# Patient Record
Sex: Female | Born: 1985
Health system: Southern US, Community
[De-identification: ages and names within clinical notes are randomized; demographics above are authoritative.]

## PROBLEM LIST (undated history)

## (undated) DIAGNOSIS — B009 Herpesviral infection, unspecified: Secondary | ICD-10-CM

## (undated) DIAGNOSIS — F329 Major depressive disorder, single episode, unspecified: Secondary | ICD-10-CM

## (undated) DIAGNOSIS — F32A Depression, unspecified: Secondary | ICD-10-CM

## (undated) HISTORY — PX: WISDOM TOOTH EXTRACTION: SHX21

---

## 2009-11-07 ENCOUNTER — Other Ambulatory Visit: Admission: RE | Admit: 2009-11-07 | Discharge: 2009-11-07 | Payer: Self-pay | Admitting: Family Medicine

## 2012-06-15 ENCOUNTER — Other Ambulatory Visit: Payer: Self-pay | Admitting: Family Medicine

## 2012-06-15 ENCOUNTER — Other Ambulatory Visit (HOSPITAL_COMMUNITY)
Admission: RE | Admit: 2012-06-15 | Discharge: 2012-06-15 | Disposition: A | Discharge status: Home / Self Care | Payer: BC Managed Care – PPO | Source: Ambulatory Visit | Attending: Family Medicine | Admitting: Family Medicine

## 2012-06-15 DIAGNOSIS — Z124 Encounter for screening for malignant neoplasm of cervix: Secondary | ICD-10-CM | POA: Insufficient documentation

## 2013-01-20 ENCOUNTER — Emergency Department (HOSPITAL_COMMUNITY)
Admission: EM | Admit: 2013-01-20 | Discharge: 2013-01-20 | Disposition: A | Discharge status: Home / Self Care | Payer: BC Managed Care – PPO | Attending: Emergency Medicine | Admitting: Emergency Medicine

## 2013-01-20 ENCOUNTER — Encounter (HOSPITAL_COMMUNITY): Payer: Self-pay | Admitting: Emergency Medicine

## 2013-01-20 DIAGNOSIS — Z3202 Encounter for pregnancy test, result negative: Secondary | ICD-10-CM | POA: Insufficient documentation

## 2013-01-20 DIAGNOSIS — Z79899 Other long term (current) drug therapy: Secondary | ICD-10-CM | POA: Insufficient documentation

## 2013-01-20 DIAGNOSIS — R110 Nausea: Secondary | ICD-10-CM | POA: Insufficient documentation

## 2013-01-20 DIAGNOSIS — F17200 Nicotine dependence, unspecified, uncomplicated: Secondary | ICD-10-CM | POA: Insufficient documentation

## 2013-01-20 DIAGNOSIS — N12 Tubulo-interstitial nephritis, not specified as acute or chronic: Secondary | ICD-10-CM | POA: Insufficient documentation

## 2013-01-20 LAB — CBC WITH DIFFERENTIAL/PLATELET
Basophils Absolute: 0.1 10*3/uL (ref 0.0–0.1)
Basophils Relative: 1 % (ref 0–1)
Eosinophils Absolute: 0.4 10*3/uL (ref 0.0–0.7)
HCT: 37.2 % (ref 36.0–46.0)
MCH: 30.9 pg (ref 26.0–34.0)
MCHC: 34.1 g/dL (ref 30.0–36.0)
Monocytes Absolute: 1.2 10*3/uL — ABNORMAL HIGH (ref 0.1–1.0)
Neutro Abs: 6.6 10*3/uL (ref 1.7–7.7)
RDW: 12.2 % (ref 11.5–15.5)

## 2013-01-20 LAB — POCT I-STAT, CHEM 8
Calcium, Ion: 1.33 mmol/L — ABNORMAL HIGH (ref 1.12–1.23)
Chloride: 102 mEq/L (ref 96–112)
Glucose, Bld: 96 mg/dL (ref 70–99)
HCT: 42 % (ref 36.0–46.0)
Hemoglobin: 14.3 g/dL (ref 12.0–15.0)
Potassium: 3.5 mEq/L (ref 3.5–5.1)
TCO2: 24 mmol/L (ref 0–100)

## 2013-01-20 LAB — URINE MICROSCOPIC-ADD ON

## 2013-01-20 LAB — URINALYSIS, ROUTINE W REFLEX MICROSCOPIC
Bilirubin Urine: NEGATIVE
Protein, ur: 100 mg/dL — AB
Specific Gravity, Urine: 1.016 (ref 1.005–1.030)
Urobilinogen, UA: 0.2 mg/dL (ref 0.0–1.0)
pH: 6.5 (ref 5.0–8.0)

## 2013-01-20 LAB — AMYLASE: Amylase: 63 U/L (ref 0–105)

## 2013-01-20 LAB — LIPASE, BLOOD: Lipase: 29 U/L (ref 11–59)

## 2013-01-20 MED ORDER — ONDANSETRON HCL 4 MG PO TABS: 4.0000 mg | ORAL_TABLET | Freq: Four times a day (QID) | ORAL | Status: DC

## 2013-01-20 MED ORDER — HYDROCODONE-ACETAMINOPHEN 5-325 MG PO TABS: 1.0000 | ORAL_TABLET | ORAL | Status: DC | PRN

## 2013-01-20 MED ORDER — ONDANSETRON HCL 4 MG/2ML IJ SOLN
4.0000 mg | Freq: Once | INTRAMUSCULAR | Status: AC
  Administered 2013-01-20: 4 mg via INTRAVENOUS
  Filled 2013-01-20: qty 2

## 2013-01-20 MED ORDER — SODIUM CHLORIDE 0.9 % IV BOLUS (SEPSIS)
1000.0000 mL | Freq: Once | INTRAVENOUS | Status: AC
  Administered 2013-01-20: 1000 mL via INTRAVENOUS

## 2013-01-20 MED ORDER — CIPROFLOXACIN IN D5W 400 MG/200ML IV SOLN
400.0000 mg | Freq: Once | INTRAVENOUS | Status: AC
  Administered 2013-01-20: 400 mg via INTRAVENOUS
  Filled 2013-01-20: qty 200

## 2013-01-20 MED ORDER — MORPHINE SULFATE 4 MG/ML IJ SOLN
4.0000 mg | Freq: Once | INTRAMUSCULAR | Status: AC
Start: 2013-01-20 — End: 2013-01-20
  Administered 2013-01-20: 4 mg via INTRAVENOUS
  Filled 2013-01-20: qty 1

## 2013-01-20 NOTE — ED Notes (Signed)
Pt was seen Monday night and treated for a UTI, she now complains of bilateral side pain that wasn't there on Monday

## 2013-01-20 NOTE — ED Provider Notes (Signed)
CSN: [id number]     Arrival date & time 01/20/13  0043 History   First MD Initiated Contact with Patient 01/20/13 0132     Chief Complaint  Patient presents with  . Abdominal Pain   (Consider location/radiation/quality/duration/timing/severity/associated sxs/prior Treatment) HPI  Patient presents to the ED with complaints of bilateral flank pain. She was seen by her PCP at [facility] for dysuria and diagnosed with UTI, given Rx for Macrobid but did not get it because of insurance problems until Tuesday evening. She has taken only one dose but is now having moderate/severe bilateral flank pain and nausea. She has not had any vomiting, diarrhea, hematuria, fevers, weakness, chills. She denies frequent history of UTI or ever having pyelo.  History reviewed. No pertinent past medical history. History reviewed. No pertinent past surgical history. History reviewed. No pertinent family history. History  Substance Use Topics  . Smoking status: Current Every Day Smoker  . Smokeless tobacco: Not on file  . Alcohol Use: No   OB History   Grav Para Term Preterm Abortions TAB SAB Ect Mult Living                 Review of Systems The patient denies anorexia, fever, weight loss,, vision loss, decreased hearing, hoarseness, chest pain, syncope, dyspnea on exertion, peripheral edema, balance deficits, hemoptysis, abdominal pain, melena, hematochezia, severe indigestion/heartburn, hematuria, incontinence, genital sores, muscle weakness, suspicious skin lesions, transient blindness, difficulty walking, depression, unusual weight change, abnormal bleeding, enlarged lymph nodes, angioedema, and breast masses.  Allergies  Doxycycline; Cephalosporins; and Sulfa antibiotics  Home Medications   Current Outpatient Rx  Name  Route  Sig  Dispense  Refill  . levonorgestrel-ethinyl estradiol (AVIANE,ALESSE,LESSINA) 0.1-20 MG-MCG tablet   Oral   Take 1 tablet by mouth daily.         .  nitrofurantoin, macrocrystal-monohydrate, (MACROBID) 100 MG capsule   Oral   Take 100 mg by mouth 2 (two) times daily. For 7 days only          BP 121/76  Pulse 83  Temp(Src) 99 F (37.2 C) (Oral)  Resp 18  Ht 5' (1.524 m)  Wt 108 lb (48.988 kg)  BMI 21.09 kg/m2  SpO2 99%  LMP 01/06/2013 Physical Exam  Nursing note and vitals reviewed. Constitutional: She appears well-developed and well-nourished. No distress.  HENT:  Head: Normocephalic and atraumatic.  Eyes: Pupils are equal, round, and reactive to light.  Neck: Normal range of motion. Neck supple.  Cardiovascular: Normal rate and regular rhythm.   Pulmonary/Chest: Effort normal.  Abdominal: Soft. There is tenderness. There is CVA tenderness. There is no rebound and no guarding.    Neurological: She is alert.  Skin: Skin is warm and dry.    ED Course  Procedures (including critical care time) Labs Review Labs Reviewed  CBC WITH DIFFERENTIAL - Abnormal; Notable for the following:    Monocytes Absolute 1.2 (*)    All other components within normal limits  URINALYSIS, ROUTINE W REFLEX MICROSCOPIC - Abnormal; Notable for the following:    APPearance CLOUDY (*)    Hgb urine dipstick LARGE (*)    Protein, ur 100 (*)    Leukocytes, UA MODERATE (*)    All other components within normal limits  POCT I-STAT, CHEM 8 - Abnormal; Notable for the following:    Calcium, Ion 1.33 (*)    All other components within normal limits  URINE CULTURE  AMYLASE  LIPASE, BLOOD  URINE MICROSCOPIC-ADD ON  POCT  PREGNANCY, URINE   Imaging Review No results found.  EKG Interpretation   None       MDM   1. Pyelonephritis      Pt clinically has pyelo. Allergic to Rocephin so will give Cipro IV and pain medication. Culture pending. Will give Rx for Vicodin and Zofran to treat for pain.  Patient otherwise appears well, labs and vitals are reassuring. She should do well being treated as an outpatient.  27 y.o.Shelly Nash's  evaluation in the Emergency Department is complete. It has been determined that no acute conditions requiring further emergency intervention are present at this time. The patient/guardian have been advised of the diagnosis and plan. We have discussed signs and symptoms that warrant return to the ED, such as changes or worsening in symptoms.  Vital signs are stable at discharge. Filed Vitals:   01/20/13 0051  BP: 121/76  Pulse: 83  Temp: 99 F (37.2 C)  Resp: 18    Patient/guardian has voiced understanding and agreed to follow-up with the PCP or specialist.    Dorthula Matas, PA-C 01/20/13 [phone]

## 2013-01-20 NOTE — ED Provider Notes (Signed)
Medical screening examination/treatment/procedure(s) were performed by non-physician practitioner and as supervising physician I was immediately available for consultation/collaboration.  Willian Donson M Kahne Helfand, MD 01/20/13 0349 

## 2013-01-21 LAB — URINE CULTURE: Culture: NO GROWTH

## 2014-03-16 LAB — OB RESULTS CONSOLE GC/CHLAMYDIA
Chlamydia: NEGATIVE
Gonorrhea: NEGATIVE

## 2014-03-16 LAB — OB RESULTS CONSOLE RUBELLA ANTIBODY, IGM: RUBELLA: IMMUNE

## 2014-03-16 LAB — OB RESULTS CONSOLE RPR: RPR: NONREACTIVE

## 2014-03-16 LAB — OB RESULTS CONSOLE HEPATITIS B SURFACE ANTIGEN: Hepatitis B Surface Ag: NEGATIVE

## 2014-03-16 LAB — OB RESULTS CONSOLE HIV ANTIBODY (ROUTINE TESTING): HIV: NONREACTIVE

## 2014-04-08 NOTE — L&D Delivery Note (Signed)
Patient was C/C/+1 and pushed for 30 minutes with epidural.   NSVD female infant, Apgars and weight pending The patient had a small midline vaginal wall laceration repaired wit h2-0 vicryl rapide. Fundus was firm. EBL was expected amount. Placenta was delivered intact. Vagina was clear.  Baby was vigorous and doing skin to skin with mother.  Shelly Nash

## 2014-09-27 LAB — OB RESULTS CONSOLE GBS: GBS: NEGATIVE

## 2014-10-29 ENCOUNTER — Encounter (HOSPITAL_COMMUNITY): Payer: Self-pay

## 2014-10-29 ENCOUNTER — Inpatient Hospital Stay (HOSPITAL_COMMUNITY)
Admission: AD | Admit: 2014-10-29 | Discharge: 2014-10-31 | DRG: 775 | Disposition: A | Discharge status: Home / Self Care | Payer: 59 | Source: Ambulatory Visit | Attending: Obstetrics and Gynecology | Admitting: Obstetrics and Gynecology

## 2014-10-29 ENCOUNTER — Inpatient Hospital Stay (HOSPITAL_COMMUNITY)
Admission: AD | Admit: 2014-10-29 | Discharge: 2014-10-29 | Disposition: A | Discharge status: Home / Self Care | Payer: 59 | Source: Ambulatory Visit | Attending: Obstetrics and Gynecology | Admitting: Obstetrics and Gynecology

## 2014-10-29 DIAGNOSIS — F17210 Nicotine dependence, cigarettes, uncomplicated: Secondary | ICD-10-CM | POA: Diagnosis present

## 2014-10-29 DIAGNOSIS — Z3A40 40 weeks gestation of pregnancy: Secondary | ICD-10-CM | POA: Diagnosis present

## 2014-10-29 DIAGNOSIS — O99334 Smoking (tobacco) complicating childbirth: Secondary | ICD-10-CM | POA: Diagnosis present

## 2014-10-29 DIAGNOSIS — Z3493 Encounter for supervision of normal pregnancy, unspecified, third trimester: Secondary | ICD-10-CM | POA: Insufficient documentation

## 2014-10-29 HISTORY — DX: Herpesviral infection, unspecified: B00.9

## 2014-10-29 HISTORY — DX: Major depressive disorder, single episode, unspecified: F32.9

## 2014-10-29 HISTORY — DX: Depression, unspecified: F32.A

## 2014-10-29 LAB — CBC
HCT: 34.5 % — ABNORMAL LOW (ref 36.0–46.0)
Hemoglobin: 11.9 g/dL — ABNORMAL LOW (ref 12.0–15.0)
MCH: 32 pg (ref 26.0–34.0)
MCHC: 34.5 g/dL (ref 30.0–36.0)
MCV: 92.7 fL (ref 78.0–100.0)
PLATELETS: 259 10*3/uL (ref 150–400)
RBC: 3.72 MIL/uL — AB (ref 3.87–5.11)
RDW: 13.3 % (ref 11.5–15.5)
WBC: 13.8 10*3/uL — AB (ref 4.0–10.5)

## 2014-10-29 LAB — TYPE AND SCREEN
ABO/RH(D): A POS
Antibody Screen: NEGATIVE

## 2014-10-29 LAB — POCT FERN TEST: POCT Fern Test: POSITIVE

## 2014-10-29 MED ORDER — TERBUTALINE SULFATE 1 MG/ML IJ SOLN: 0.2500 mg | Freq: Once | INTRAMUSCULAR | Status: AC | PRN

## 2014-10-29 MED ORDER — CITRIC ACID-SODIUM CITRATE 334-500 MG/5ML PO SOLN: 30.0000 mL | ORAL | Status: DC | PRN

## 2014-10-29 MED ORDER — LIDOCAINE HCL (PF) 1 % IJ SOLN
30.0000 mL | INTRAMUSCULAR | Status: DC | PRN
  Administered 2014-10-30: 30 mL via SUBCUTANEOUS
  Filled 2014-10-29: qty 30

## 2014-10-29 MED ORDER — ACETAMINOPHEN 325 MG PO TABS: 650.0000 mg | ORAL_TABLET | ORAL | Status: DC | PRN

## 2014-10-29 MED ORDER — ONDANSETRON HCL 4 MG/2ML IJ SOLN
4.0000 mg | Freq: Four times a day (QID) | INTRAMUSCULAR | Status: DC | PRN
  Administered 2014-10-30: 4 mg via INTRAVENOUS
  Filled 2014-10-29: qty 2

## 2014-10-29 MED ORDER — OXYCODONE-ACETAMINOPHEN 5-325 MG PO TABS: 2.0000 | ORAL_TABLET | ORAL | Status: DC | PRN

## 2014-10-29 MED ORDER — FLEET ENEMA 7-19 GM/118ML RE ENEM: 1.0000 | ENEMA | RECTAL | Status: DC | PRN

## 2014-10-29 MED ORDER — OXYTOCIN 40 UNITS IN LACTATED RINGERS INFUSION - SIMPLE MED
1.0000 m[IU]/min | INTRAVENOUS | Status: DC
  Administered 2014-10-29: 2 m[IU]/min via INTRAVENOUS
  Filled 2014-10-29: qty 1000

## 2014-10-29 MED ORDER — OXYTOCIN BOLUS FROM INFUSION
500.0000 mL | INTRAVENOUS | Status: DC
  Administered 2014-10-30: 500 mL via INTRAVENOUS

## 2014-10-29 MED ORDER — BUTORPHANOL TARTRATE 1 MG/ML IJ SOLN
1.0000 mg | INTRAMUSCULAR | Status: DC | PRN
  Administered 2014-10-29: 1 mg via INTRAVENOUS
  Filled 2014-10-29: qty 1

## 2014-10-29 MED ORDER — LACTATED RINGERS IV SOLN: 500.0000 mL | INTRAVENOUS | Status: DC | PRN

## 2014-10-29 MED ORDER — OXYTOCIN 40 UNITS IN LACTATED RINGERS INFUSION - SIMPLE MED: 62.5000 mL/h | INTRAVENOUS | Status: DC

## 2014-10-29 MED ORDER — OXYCODONE-ACETAMINOPHEN 5-325 MG PO TABS
1.0000 | ORAL_TABLET | ORAL | Status: DC | PRN
Start: 2014-10-29 — End: 2014-10-30

## 2014-10-29 MED ORDER — LACTATED RINGERS IV SOLN
INTRAVENOUS | Status: DC
  Administered 2014-10-29 – 2014-10-30 (×2): via INTRAVENOUS

## 2014-10-29 NOTE — Discharge Instructions (Signed)
Third Trimester of Pregnancy °The third trimester is from week 29 through week 42, months 7 through 9. The third trimester is a time when the fetus is growing rapidly. At the end of the ninth month, the fetus is about 20 inches in length and weighs 6-10 pounds.  °BODY CHANGES °Your body goes through many changes during pregnancy. The changes vary from woman to woman.  °· Your weight will continue to increase. You can expect to gain 25-35 pounds (11-16 kg) by the end of the pregnancy. °· You may begin to get stretch marks on your hips, abdomen, and breasts. °· You may urinate more often because the fetus is moving lower into your pelvis and pressing on your bladder. °· You may develop or continue to have heartburn as a result of your pregnancy. °· You may develop constipation because certain hormones are causing the muscles that push waste through your intestines to slow down. °· You may develop hemorrhoids or swollen, bulging veins (varicose veins). °· You may have pelvic pain because of the weight gain and pregnancy hormones relaxing your joints between the bones in your pelvis. Backaches may result from overexertion of the muscles supporting your posture. °· You may have changes in your hair. These can include thickening of your hair, rapid growth, and changes in texture. Some women also have hair loss during or after pregnancy, or hair that feels dry or thin. Your hair will most likely return to normal after your baby is born. °· Your breasts will continue to grow and be tender. A yellow discharge may leak from your breasts called colostrum. °· Your belly button may stick out. °· You may feel short of breath because of your expanding uterus. °· You may notice the fetus "dropping," or moving lower in your abdomen. °· You may have a bloody mucus discharge. This usually occurs a few days to a week before labor begins. °· Your cervix becomes thin and soft (effaced) near your due date. °WHAT TO EXPECT AT YOUR PRENATAL  EXAMS  °You will have prenatal exams every 2 weeks until week 36. Then, you will have weekly prenatal exams. During a routine prenatal visit: °· You will be weighed to make sure you and the fetus are growing normally. °· Your blood pressure is taken. °· Your abdomen will be measured to track your baby's growth. °· The fetal heartbeat will be listened to. °· Any test results from the previous visit will be discussed. °· You may have a cervical check near your due date to see if you have effaced. °At around 36 weeks, your caregiver will check your cervix. At the same time, your caregiver will also perform a test on the secretions of the vaginal tissue. This test is to determine if a type of bacteria, Group B streptococcus, is present. Your caregiver will explain this further. °Your caregiver may ask you: °· What your birth plan is. °· How you are feeling. °· If you are feeling the baby move. °· If you have had any abnormal symptoms, such as leaking fluid, bleeding, severe headaches, or abdominal cramping. °· If you have any questions. °Other tests or screenings that may be performed during your third trimester include: °· Blood tests that check for low iron levels (anemia). °· Fetal testing to check the health, activity level, and growth of the fetus. Testing is done if you have certain medical conditions or if there are problems during the pregnancy. °FALSE LABOR °You may feel small, irregular contractions that   eventually go away. These are called Braxton Hicks contractions, or false labor. Contractions may last for hours, days, or even weeks before true labor sets in. If contractions come at regular intervals, intensify, or become painful, it is best to be seen by your caregiver.  °SIGNS OF LABOR  °· Menstrual-like cramps. °· Contractions that are 5 minutes apart or less. °· Contractions that start on the top of the uterus and spread down to the lower abdomen and back. °· A sense of increased pelvic pressure or back  pain. °· A watery or bloody mucus discharge that comes from the vagina. °If you have any of these signs before the 37th week of pregnancy, call your caregiver right away. You need to go to the hospital to get checked immediately. °HOME CARE INSTRUCTIONS  °· Avoid all smoking, herbs, alcohol, and unprescribed drugs. These chemicals affect the formation and growth of the baby. °· Follow your caregiver's instructions regarding medicine use. There are medicines that are either safe or unsafe to take during pregnancy. °· Exercise only as directed by your caregiver. Experiencing uterine cramps is a good sign to stop exercising. °· Continue to eat regular, healthy meals. °· Wear a good support bra for breast tenderness. °· Do not use hot tubs, steam rooms, or saunas. °· Wear your seat belt at all times when driving. °· Avoid raw meat, uncooked cheese, cat litter boxes, and soil used by cats. These carry germs that can cause birth defects in the baby. °· Take your prenatal vitamins. °· Try taking a stool softener (if your caregiver approves) if you develop constipation. Eat more high-fiber foods, such as fresh vegetables or fruit and whole grains. Drink plenty of fluids to keep your urine clear or pale yellow. °· Take warm sitz baths to soothe any pain or discomfort caused by hemorrhoids. Use hemorrhoid cream if your caregiver approves. °· If you develop varicose veins, wear support hose. Elevate your feet for 15 minutes, 3-4 times a day. Limit salt in your diet. °· Avoid heavy lifting, wear low heal shoes, and practice good posture. °· Rest a lot with your legs elevated if you have leg cramps or low back pain. °· Visit your dentist if you have not gone during your pregnancy. Use a soft toothbrush to brush your teeth and be gentle when you floss. °· A sexual relationship may be continued unless your caregiver directs you otherwise. °· Do not travel far distances unless it is absolutely necessary and only with the approval  of your caregiver. °· Take prenatal classes to understand, practice, and ask questions about the labor and delivery. °· Make a trial run to the hospital. °· Pack your hospital bag. °· Prepare the baby's nursery. °· Continue to go to all your prenatal visits as directed by your caregiver. °SEEK MEDICAL CARE IF: °· You are unsure if you are in labor or if your water has broken. °· You have dizziness. °· You have mild pelvic cramps, pelvic pressure, or nagging pain in your abdominal area. °· You have persistent nausea, vomiting, or diarrhea. °· You have a bad smelling vaginal discharge. °· You have pain with urination. °SEEK IMMEDIATE MEDICAL CARE IF:  °· You have a fever. °· You are leaking fluid from your vagina. °· You have spotting or bleeding from your vagina. °· You have severe abdominal cramping or pain. °· You have rapid weight loss or gain. °· You have shortness of breath with chest pain. °· You notice sudden or extreme swelling   of your face, hands, ankles, feet, or legs. °· You have not felt your baby move in over an hour. °· You have severe headaches that do not go away with medicine. °· You have vision changes. °Document Released: 03/19/2001 Document Revised: 03/30/2013 Document Reviewed: 05/26/2012 °[employer]® Patient Information ©2015 [employer]. This information is not intended to replace advice given to you by your health care provider. Make sure you discuss any questions you have with your health care provider. °Fetal Movement Counts °Patient Name: __________________________________________________ Patient Due Date: ____________________ °Performing a fetal movement count is highly recommended in high-risk pregnancies, but it is good for every pregnant woman to do. Your health care provider may ask you to start counting fetal movements at 28 weeks of the pregnancy. Fetal movements often increase: °· After eating a full meal. °· After physical activity. °· After eating or drinking something sweet or  cold. °· At rest. °Pay attention to when you feel the baby is most active. This will help you notice a pattern of your baby's sleep and wake cycles and what factors contribute to an increase in fetal movement. It is important to perform a fetal movement count at the same time each day when your baby is normally most active.  °HOW TO COUNT FETAL MOVEMENTS °· Find a quiet and comfortable area to sit or lie down on your left side. Lying on your left side provides the best blood and oxygen circulation to your baby. °· Write down the day and time on a sheet of paper or in a journal. °· Start counting kicks, flutters, swishes, rolls, or jabs in a 2-hour period. You should feel at least 10 movements within 2 hours. °· If you do not feel 10 movements in 2 hours, wait 2-3 hours and count again. Look for a change in the pattern or not enough counts in 2 hours. °SEEK MEDICAL CARE IF: °· You feel less than 10 counts in 2 hours, tried twice. °· There is no movement in over an hour. °· The pattern is changing or taking longer each day to reach 10 counts in 2 hours. °· You feel the baby is not moving as he or she usually does. °Date: ____________ Movements: ____________ Start time: ____________ Finish time: ____________  °Date: ____________ Movements: ____________ Start time: ____________ Finish time: ____________ °Date: ____________ Movements: ____________ Start time: ____________ Finish time: ____________ °Date: ____________ Movements: ____________ Start time: ____________ Finish time: ____________ °Date: ____________ Movements: ____________ Start time: ____________ Finish time: ____________ °Date: ____________ Movements: ____________ Start time: ____________ Finish time: ____________ °Date: ____________ Movements: ____________ Start time: ____________ Finish time: ____________ °Date: ____________ Movements: ____________ Start time: ____________ Finish time: ____________  °Date: ____________ Movements: ____________ Start time:  ____________ Finish time: ____________ °Date: ____________ Movements: ____________ Start time: ____________ Finish time: ____________ °Date: ____________ Movements: ____________ Start time: ____________ Finish time: ____________ °Date: ____________ Movements: ____________ Start time: ____________ Finish time: ____________ °Date: ____________ Movements: ____________ Start time: ____________ Finish time: ____________ °Date: ____________ Movements: ____________ Start time: ____________ Finish time: ____________ °Date: ____________ Movements: ____________ Start time: ____________ Finish time: ____________  °Date: ____________ Movements: ____________ Start time: ____________ Finish time: ____________ °Date: ____________ Movements: ____________ Start time: ____________ Finish time: ____________ °Date: ____________ Movements: ____________ Start time: ____________ Finish time: ____________ °Date: ____________ Movements: ____________ Start time: ____________ Finish time: ____________ °Date: ____________ Movements: ____________ Start time: ____________ Finish time: ____________ °Date: ____________ Movements: ____________ Start time: ____________ Finish time: ____________ °Date: ____________ Movements: ____________ Start time: ____________ Finish time:   ____________  °Date: ____________ Movements: ____________ Start time: ____________ Finish time: ____________ °Date: ____________ Movements: ____________ Start time: ____________ Finish time: ____________ °Date: ____________ Movements: ____________ Start time: ____________ Finish time: ____________ °Date: ____________ Movements: ____________ Start time: ____________ Finish time: ____________ °Date: ____________ Movements: ____________ Start time: ____________ Finish time: ____________ °Date: ____________ Movements: ____________ Start time: ____________ Finish time: ____________ °Date: ____________ Movements: ____________ Start time: ____________ Finish time: ____________  °Date:  ____________ Movements: ____________ Start time: ____________ Finish time: ____________ °Date: ____________ Movements: ____________ Start time: ____________ Finish time: ____________ °Date: ____________ Movements: ____________ Start time: ____________ Finish time: ____________ °Date: ____________ Movements: ____________ Start time: ____________ Finish time: ____________ °Date: ____________ Movements: ____________ Start time: ____________ Finish time: ____________ °Date: ____________ Movements: ____________ Start time: ____________ Finish time: ____________ °Date: ____________ Movements: ____________ Start time: ____________ Finish time: ____________  °Date: ____________ Movements: ____________ Start time: ____________ Finish time: ____________ °Date: ____________ Movements: ____________ Start time: ____________ Finish time: ____________ °Date: ____________ Movements: ____________ Start time: ____________ Finish time: ____________ °Date: ____________ Movements: ____________ Start time: ____________ Finish time: ____________ °Date: ____________ Movements: ____________ Start time: ____________ Finish time: ____________ °Date: ____________ Movements: ____________ Start time: ____________ Finish time: ____________ °Date: ____________ Movements: ____________ Start time: ____________ Finish time: ____________  °Date: ____________ Movements: ____________ Start time: ____________ Finish time: ____________ °Date: ____________ Movements: ____________ Start time: ____________ Finish time: ____________ °Date: ____________ Movements: ____________ Start time: ____________ Finish time: ____________ °Date: ____________ Movements: ____________ Start time: ____________ Finish time: ____________ °Date: ____________ Movements: ____________ Start time: ____________ Finish time: ____________ °Date: ____________ Movements: ____________ Start time: ____________ Finish time: ____________ °Date: ____________ Movements: ____________ Start  time: ____________ Finish time: ____________  °Date: ____________ Movements: ____________ Start time: ____________ Finish time: ____________ °Date: ____________ Movements: ____________ Start time: ____________ Finish time: ____________ °Date: ____________ Movements: ____________ Start time: ____________ Finish time: ____________ °Date: ____________ Movements: ____________ Start time: ____________ Finish time: ____________ °Date: ____________ Movements: ____________ Start time: ____________ Finish time: ____________ °Date: ____________ Movements: ____________ Start time: ____________ Finish time: ____________ °Document Released: 04/24/2006 Document Revised: 08/09/2013 Document Reviewed: 01/20/2012 °[employer]® Patient Information ©2015 [employer]. This information is not intended to replace advice given to you by your health care provider. Make sure you discuss any questions you have with your health care provider. °Braxton Hicks Contractions °Contractions of the uterus can occur throughout pregnancy. Contractions are not always a sign that you are in labor.  °WHAT ARE BRAXTON HICKS CONTRACTIONS?  °Contractions that occur before labor are called Braxton Hicks contractions, or false labor. Toward the end of pregnancy (32-34 weeks), these contractions can develop more often and may become more forceful. This is not true labor because these contractions do not result in opening (dilatation) and thinning of the cervix. They are sometimes difficult to tell apart from true labor because these contractions can be forceful and people have different pain tolerances. You should not feel embarrassed if you go to the hospital with false labor. Sometimes, the only way to tell if you are in true labor is for your health care provider to look for changes in the cervix. °If there are no prenatal problems or other health problems associated with the pregnancy, it is completely safe to be sent home with false labor and await the  onset of true labor. °HOW CAN YOU TELL THE DIFFERENCE BETWEEN TRUE AND FALSE LABOR? °False Labor °· The contractions of false labor are usually shorter and not as hard as those of true labor.   °· The contractions   are usually irregular.   °· The contractions are often felt in the front of the lower abdomen and in the groin.   °· The contractions may go away when you walk around or change positions while lying down.   °· The contractions get weaker and are shorter lasting as time goes on.   °· The contractions do not usually become progressively stronger, regular, and closer together as with true labor.   °True Labor °· Contractions in true labor last 30-70 seconds, become very regular, usually become more intense, and increase in frequency.   °· The contractions do not go away with walking.   °· The discomfort is usually felt in the top of the uterus and spreads to the lower abdomen and low back.   °· True labor can be determined by your health care provider with an exam. This will show that the cervix is dilating and getting thinner.   °WHAT TO REMEMBER °· Keep up with your usual exercises and follow other instructions given by your health care provider.   °· Take medicines as directed by your health care provider.   °· Keep your regular prenatal appointments.   °· Eat and drink lightly if you think you are going into labor.   °· If Braxton Hicks contractions are making you uncomfortable:   °· Change your position from lying down or resting to walking, or from walking to resting.   °· Sit and rest in a tub of warm water.   °· Drink 2-3 glasses of water. Dehydration may cause these contractions.   °· Do slow and deep breathing several times an hour.   °WHEN SHOULD I SEEK IMMEDIATE MEDICAL CARE? °Seek immediate medical care if: °· Your contractions become stronger, more regular, and closer together.   °· You have fluid leaking or gushing from your vagina.   °· You have a fever.   °· You pass blood-tinged mucus.    °· You have vaginal bleeding.   °· You have continuous abdominal pain.   °· You have low back pain that you never had before.   °· You feel your baby's head pushing down and causing pelvic pressure.   °· Your baby is not moving as much as it used to.   °Document Released: 03/25/2005 Document Revised: 03/30/2013 Document Reviewed: 01/04/2013 °[employer]® Patient Information ©2015 [employer]. This information is not intended to replace advice given to you by your health care provider. Make sure you discuss any questions you have with your health care provider. ° °

## 2014-10-29 NOTE — MAU Note (Signed)
Pt states woke up thinking water may be broke. Very slight trickle randomly since then. No bleeding. Irregular contractions.

## 2014-10-29 NOTE — MAU Note (Signed)
Contractions worse. Was 2 cm earlier today.  No bleeding .Baby moving well.  Leaking clear at 1 pm.

## 2014-10-30 ENCOUNTER — Inpatient Hospital Stay (HOSPITAL_COMMUNITY): Payer: 59 | Admitting: Anesthesiology

## 2014-10-30 ENCOUNTER — Encounter (HOSPITAL_COMMUNITY): Payer: Self-pay | Admitting: *Deleted

## 2014-10-30 LAB — ABO/RH: ABO/RH(D): A POS

## 2014-10-30 LAB — RPR: RPR: NONREACTIVE

## 2014-10-30 MED ORDER — TETANUS-DIPHTH-ACELL PERTUSSIS 5-2.5-18.5 LF-MCG/0.5 IM SUSP: 0.5000 mL | Freq: Once | INTRAMUSCULAR | Status: DC

## 2014-10-30 MED ORDER — ONDANSETRON HCL 4 MG/2ML IJ SOLN: 4.0000 mg | INTRAMUSCULAR | Status: DC | PRN

## 2014-10-30 MED ORDER — FENTANYL 2.5 MCG/ML BUPIVACAINE 1/10 % EPIDURAL INFUSION (WH - ANES)
14.0000 mL/h | INTRAMUSCULAR | Status: DC | PRN
  Administered 2014-10-30: 12 mL/h via EPIDURAL
  Administered 2014-10-30: 14 mL/h via EPIDURAL

## 2014-10-30 MED ORDER — EPHEDRINE 5 MG/ML INJ: 10.0000 mg | INTRAVENOUS | Status: DC | PRN

## 2014-10-30 MED ORDER — IBUPROFEN 600 MG PO TABS
600.0000 mg | ORAL_TABLET | Freq: Four times a day (QID) | ORAL | Status: DC
  Administered 2014-10-30 – 2014-10-31 (×4): 600 mg via ORAL
  Filled 2014-10-30 (×5): qty 1

## 2014-10-30 MED ORDER — OXYCODONE-ACETAMINOPHEN 5-325 MG PO TABS: 1.0000 | ORAL_TABLET | ORAL | Status: DC | PRN

## 2014-10-30 MED ORDER — WITCH HAZEL-GLYCERIN EX PADS: 1.0000 "application " | MEDICATED_PAD | CUTANEOUS | Status: DC | PRN

## 2014-10-30 MED ORDER — DIPHENHYDRAMINE HCL 25 MG PO CAPS: 25.0000 mg | ORAL_CAPSULE | Freq: Four times a day (QID) | ORAL | Status: DC | PRN

## 2014-10-30 MED ORDER — ONDANSETRON HCL 4 MG PO TABS: 4.0000 mg | ORAL_TABLET | ORAL | Status: DC | PRN

## 2014-10-30 MED ORDER — DIPHENHYDRAMINE HCL 50 MG/ML IJ SOLN: 12.5000 mg | INTRAMUSCULAR | Status: DC | PRN

## 2014-10-30 MED ORDER — BUPIVACAINE HCL (PF) 0.25 % IJ SOLN
INTRAMUSCULAR | Status: DC | PRN
  Administered 2014-10-30 (×2): 4 mL

## 2014-10-30 MED ORDER — DIBUCAINE 1 % RE OINT
1.0000 "application " | TOPICAL_OINTMENT | RECTAL | Status: DC | PRN
  Filled 2014-10-30: qty 28

## 2014-10-30 MED ORDER — OXYCODONE-ACETAMINOPHEN 5-325 MG PO TABS: 2.0000 | ORAL_TABLET | ORAL | Status: DC | PRN

## 2014-10-30 MED ORDER — BENZOCAINE-MENTHOL 20-0.5 % EX AERO
1.0000 "application " | INHALATION_SPRAY | CUTANEOUS | Status: DC | PRN
  Administered 2014-10-30: 1 via TOPICAL
  Filled 2014-10-30 (×2): qty 56

## 2014-10-30 MED ORDER — PHENYLEPHRINE 40 MCG/ML (10ML) SYRINGE FOR IV PUSH (FOR BLOOD PRESSURE SUPPORT)
PREFILLED_SYRINGE | INTRAVENOUS | Status: AC
  Filled 2014-10-30: qty 20

## 2014-10-30 MED ORDER — ACETAMINOPHEN 325 MG PO TABS: 650.0000 mg | ORAL_TABLET | ORAL | Status: DC | PRN

## 2014-10-30 MED ORDER — PRENATAL MULTIVITAMIN CH
1.0000 | ORAL_TABLET | Freq: Every day | ORAL | Status: DC
  Administered 2014-10-30: 1 via ORAL
  Filled 2014-10-30 (×2): qty 1

## 2014-10-30 MED ORDER — LIDOCAINE-EPINEPHRINE (PF) 2 %-1:200000 IJ SOLN
INTRAMUSCULAR | Status: DC | PRN
  Administered 2014-10-30: 4 mL

## 2014-10-30 MED ORDER — ZOLPIDEM TARTRATE 5 MG PO TABS: 5.0000 mg | ORAL_TABLET | Freq: Every evening | ORAL | Status: DC | PRN

## 2014-10-30 MED ORDER — LANOLIN HYDROUS EX OINT: TOPICAL_OINTMENT | CUTANEOUS | Status: DC | PRN

## 2014-10-30 MED ORDER — PHENYLEPHRINE 40 MCG/ML (10ML) SYRINGE FOR IV PUSH (FOR BLOOD PRESSURE SUPPORT): 80.0000 ug | PREFILLED_SYRINGE | INTRAVENOUS | Status: DC | PRN

## 2014-10-30 MED ORDER — FENTANYL 2.5 MCG/ML BUPIVACAINE 1/10 % EPIDURAL INFUSION (WH - ANES)
INTRAMUSCULAR | Status: AC
  Filled 2014-10-30: qty 125

## 2014-10-30 MED ORDER — SIMETHICONE 80 MG PO CHEW
80.0000 mg | CHEWABLE_TABLET | ORAL | Status: DC | PRN
Start: 2014-10-30 — End: 2014-10-31

## 2014-10-30 MED ORDER — SENNOSIDES-DOCUSATE SODIUM 8.6-50 MG PO TABS
2.0000 | ORAL_TABLET | ORAL | Status: DC
  Administered 2014-10-31: 2 via ORAL
  Filled 2014-10-30: qty 2

## 2014-10-30 NOTE — Anesthesia Postprocedure Evaluation (Signed)
  Anesthesia Post-op Note  Patient: Shelly Nash  Procedure(s) Performed: * No procedures listed *  Patient Location: Mother/Baby  Anesthesia Type:Epidural  Level of Consciousness: awake, alert , oriented and patient cooperative  Airway and Oxygen Therapy: Patient Spontanous Breathing  Post-op Pain: none  Post-op Assessment: Post-op Vital signs reviewed, Patient's Cardiovascular Status Stable, Respiratory Function Stable, Patent Airway, No headache, No backache and Patient able to bend at knees              Post-op Vital Signs: Reviewed and stable  Last Vitals:  Filed Vitals:   10/30/14 0853  BP: 115/53  Pulse: 56  Temp: 37 C  Resp: 18    Complications: No apparent anesthesia complications

## 2014-10-30 NOTE — H&P (Signed)
29 y.o. [redacted]w[redacted]d  G1P0 comes in c/o LOF and ctx.  Was seen earlier 7/23 for possible SROM and reported to be fern negative by MAU with contraction that pt was not feeling.  She was discharged home and called a few hours later stating contractions had become stronger and she was having continued leakage.  She was advised to return to [facility] for evaluation.  Otherwise has good fetal movement and no bleeding.  Past Medical History  Diagnosis Date  . Depression   . HSV infection     Past Surgical History  Procedure Laterality Date  . Wisdom tooth extraction      OB History  Gravida Para Term Preterm AB SAB TAB Ectopic Multiple Living  1             # Outcome Date GA Lbr Len/2nd Weight Sex Delivery Anes PTL Lv  1 Current               History   Social History  . Marital Status: Single    Spouse Name: N/A  . Number of Children: N/A  . Years of Education: N/A   Occupational History  . Not on file.   Social History Main Topics  . Smoking status: Current Every Day Smoker -- 0.25 packs/day    Types: Cigarettes  . Smokeless tobacco: Not on file  . Alcohol Use: No  . Drug Use: No  . Sexual Activity: Not on file   Other Topics Concern  . Not on file   Social History Narrative   Doxycycline; Cephalosporins; and Sulfa antibiotics    Prenatal Transfer Tool  Maternal Diabetes: No Genetic Screening: Declined Maternal Ultrasounds/Referrals: Normal Fetal Ultrasounds or other Referrals:  None Maternal Substance Abuse:  Yes:  Type: Smoker Significant Maternal Medications:  None Significant Maternal Lab Results: Lab values include: Group B Strep negative  Other PNC: uncomplicated.    Filed Vitals:   10/29/14 2353  BP: 120/62  Pulse: 79  Temp:   Resp: 18     Lungs/Cor:  NAD Abdomen:  soft, gravid Ex:  no cords, erythema SVE:  3/90/-2 FHTs:  130, good STV, NST R Toco:  q3-4   A/P   Admit to L&D with PROM - suspect rupture at 1pm  GBS Neg  Pitocine 2x2 if no change in 2  hours  Epidural upon request  Routine care  [place], Luther Parody

## 2014-10-30 NOTE — Lactation Note (Signed)
This note was copied from the chart of Shelly Teana Lindahl. Lactation Consultation Note  Patient Name: Shelly Nash ZOXWR'U Date: 10/30/2014  @ the start of the consult baby already latched with depth , noted multiply swallows . LC showed mom how to use breast compression technique while baby was latched. Increased swallows noted. Baby sustained latch for 12 mins and released . Nipple appeared  Normal shape. LC reviewed basic steps for latching - prior to latch - breast massage , hand express,  Latch with breast compressions until the baby is in a consistent swallowing pattern and then intermittent. Firm support will aide in a deep latch and prevent sore nipples. After baby feed, baby had a wet and loose mec stool. LC assisted mom to change diaper. Mom and dad had attended the [facility] Breastfeeding class, and mentioned it was very helpful . As new parents seem very calm about the breast feeding and were receptive to review of basics breast feeding. LC praised mom for her efforts and dad for being so supportive.  Mother informed of post-discharge support and given phone number to the lactation department, including services for phone call assistance; out-patient appointments; and breastfeeding support group. List of other breastfeeding resources in the community given in the handout. Encouraged mother to call for problems or concerns related to breastfeeding.   Maternal Data    Feeding    [facility] Score/Interventions                      Lactation Tools Discussed/Used     Consult Status      Shelly Nash 10/30/2014, 10:42 PM

## 2014-10-30 NOTE — Anesthesia Preprocedure Evaluation (Signed)
Anesthesia Evaluation  Patient identified by MRN, date of birth, ID band Patient awake    Reviewed: Allergy & Precautions, Patient's Chart, lab work & pertinent test results  History of Anesthesia Complications Negative for: history of anesthetic complications  Airway Mallampati: II  TM Distance: >3 FB Neck ROM: Full    Dental  (+) Teeth Intact   Pulmonary Current Smoker,  breath sounds clear to auscultation        Cardiovascular negative cardio ROS  Rhythm:Regular     Neuro/Psych PSYCHIATRIC DISORDERS Depression negative neurological ROS     GI/Hepatic negative GI ROS, Neg liver ROS,   Endo/Other  negative endocrine ROS  Renal/GU negative Renal ROS     Musculoskeletal   Abdominal   Peds  Hematology negative hematology ROS (+)   Anesthesia Other Findings   Reproductive/Obstetrics (+) Pregnancy                             Anesthesia Physical Anesthesia Plan  ASA: II  Anesthesia Plan: Epidural   Post-op Pain Management:    Induction:   Airway Management Planned:   Additional Equipment:   Intra-op Plan:   Post-operative Plan:   Informed Consent: I have reviewed the patients History and Physical, chart, labs and discussed the procedure including the risks, benefits and alternatives for the proposed anesthesia with the patient or authorized representative who has indicated his/her understanding and acceptance.   Dental advisory given  Plan Discussed with: Anesthesiologist  Anesthesia Plan Comments:         Anesthesia Quick Evaluation

## 2014-10-30 NOTE — Anesthesia Procedure Notes (Signed)
Epidural Patient location during procedure: OB  Staffing Anesthesiologist: Triana Coover, CHRIS Performed by: anesthesiologist   Preanesthetic Checklist Completed: patient identified, surgical consent, pre-op evaluation, timeout performed, IV checked, risks and benefits discussed and monitors and equipment checked  Epidural Patient position: sitting Prep: DuraPrep Patient monitoring: heart rate, cardiac monitor, continuous pulse ox and blood pressure Approach: midline Location: L3-L4 Injection technique: LOR saline  Needle:  Needle type: Tuohy  Needle gauge: 17 G Needle length: 9 cm Needle insertion depth: 5 cm Catheter type: closed end flexible Catheter size: 19 Gauge Catheter at skin depth: 10 cm Test dose: negative and 2% lidocaine with Epi 1:200 K  Assessment Events: blood not aspirated, injection not painful, no injection resistance, negative IV test and no paresthesia  Additional Notes Reason for block:procedure for pain

## 2014-10-31 LAB — CBC
HCT: 28.9 % — ABNORMAL LOW (ref 36.0–46.0)
HEMOGLOBIN: 9.8 g/dL — AB (ref 12.0–15.0)
MCH: 32.3 pg (ref 26.0–34.0)
MCHC: 33.9 g/dL (ref 30.0–36.0)
MCV: 95.4 fL (ref 78.0–100.0)
PLATELETS: 220 10*3/uL (ref 150–400)
RBC: 3.03 MIL/uL — AB (ref 3.87–5.11)
RDW: 13.4 % (ref 11.5–15.5)
WBC: 15.1 10*3/uL — ABNORMAL HIGH (ref 4.0–10.5)

## 2014-10-31 MED ORDER — IBUPROFEN 600 MG PO TABS: 600.0000 mg | ORAL_TABLET | Freq: Four times a day (QID) | ORAL | Status: AC | PRN

## 2014-10-31 MED ORDER — PNEUMOCOCCAL VAC POLYVALENT 25 MCG/0.5ML IJ INJ
0.5000 mL | INJECTION | INTRAMUSCULAR | Status: DC
  Filled 2014-10-31: qty 0.5

## 2014-10-31 MED ORDER — OXYCODONE-ACETAMINOPHEN 5-325 MG PO TABS: 2.0000 | ORAL_TABLET | ORAL | Status: AC | PRN

## 2014-10-31 MED ORDER — DOCUSATE SODIUM 100 MG PO CAPS: 100.0000 mg | ORAL_CAPSULE | Freq: Two times a day (BID) | ORAL | Status: AC

## 2014-10-31 NOTE — Lactation Note (Signed)
This note was copied from the chart of Shelly Nash. Lactation Consultation Note  Patient Name: Shelly Nash ZOXWR'U Date: 10/31/2014 Reason for consult: Follow-up assessment  With this mom of a term baby, now 96 hours old. Mom is doing very well with breast feeding. I assisted her with positioning for football hold, and baby latches easily and deeply. Mom's nipples are tender, but appear normal after latch. Breast care , lactation support group reviewed with mom. Mom and baby being discharged to home today   Maternal Data    Feeding Feeding Type:  (mom is asleep will ask about feeds when wakes up)  LATCH Score/Interventions                      Lactation Tools Discussed/Used     Consult Status Consult Status: Complete Follow-up type: Call as needed    [name] 10/31/2014, 9:27 AM

## 2014-10-31 NOTE — Discharge Summary (Signed)
Obstetric Discharge Summary Reason for Admission: onset of labor Prenatal Procedures: ultrasound Intrapartum Procedures: spontaneous vaginal delivery Postpartum Procedures: none Complications-Operative and Postpartum: 2nd degree perineal laceration HEMOGLOBIN  Date Value Ref Range Status  10/31/2014 9.8* 12.0 - 15.0 g/dL Final   HCT  Date Value Ref Range Status  10/31/2014 28.9* 36.0 - 46.0 % Final    Physical Exam:  General: alert, cooperative and appears stated age 29: appropriate Uterine Fundus: firm Incision: healing well DVT Evaluation: No evidence of DVT seen on physical exam.  Discharge Diagnoses: Term Pregnancy-delivered  Discharge Information: Date: 10/31/2014 Activity: pelvic rest Diet: routine Medications: Ibuprofen, Colace and Percocet Condition: stable Instructions: refer to practice specific booklet Discharge to: home Follow-up Information    Follow up with CALLAHAN, SIDNEY, DO In 4 weeks.   Specialty:  Obstetrics and Gynecology   Why:  For a postpartum evaluation   Contact information:   [address] [place] [address] [phone]       Newborn Data: Live born female  Birth Weight: 6 lb 6.8 oz (2915 g) APGAR: 8, 9  Home with mother.  Shelly Nash H. 10/31/2014, 6:21 PM

## 2014-10-31 NOTE — Clinical Social Work Maternal (Signed)
  CLINICAL SOCIAL WORK MATERNAL/CHILD NOTE  Patient Details  Name: Shelly Nash MRN: [id number] Date of Birth: 10/06/1985  Date:  10/31/2014  Clinical Social Worker Initiating Note:  Chad Donoghue, LCSW Date/ Time Initiated:  10/31/14/1130     Child's Name:  Harper   Legal Guardian:  Shelly Nash and Shelly Nash   Need for Interpreter:  None   Date of Referral:  10/30/14     Reason for Referral:  History of depression  Referral Source:  [facility]   Address:  [address] [place], [place] [address]  Phone number:  [phone]   Household Members:  Significant Other   Natural Supports (not living in the home):  Extended Family, Immediate Family   Professional Supports: None   Employment: Full-time   Type of Work:   N/A  Education:    N/A  Financial Resources:  Private Insurance   Other Resources:    N/A  Cultural/Religious Considerations Which May Impact Care:  None reported  Strengths:  Ability to meet basic needs , Pediatrician chosen , Home prepared for child    Risk Factors/Current Problems:  Mental Health Concerns : History of anxiety since age 12/13.  MOB reported normative range in emotions during a pregnancy, but shared that she noted increase in anxiety during the pregnancy. MOB presents with insight and self-awareness related to her mental health needs.    Cognitive State:  Able to Concentrate , Alert , Insightful , Linear Thinking , Goal Oriented    Mood/Affect:  Comfortable , Calm , Interested    CSW Assessment:  CSW received request for consult due to MOB presenting with a history of depression.  MOB presented as easily engaged and receptive to the visit. She displayed a full range in affect and presented in a pleasant mood.  FOB also present, and presented as attentive and supportive.  MOB and FOB expressed eagerness and readiness to be discharged home.  MOB expressed normative range emotions that accompany role transition, and shared  that the home is prepared and that she feels supported by family and friends.   Per MOB, she has a history of depression since age 13 or 29 years old. She stated that she has not needed any medication for symptom control for approximately 6 years.  MOB shared that she noted normative range in emotions during the pregnancy, but did not feel any ongoing depressive symptoms. MOB discussed that she did feel anxious as she worried about her ability to be a good mother and preparing for the infant. She stated that she found talking to the FOB was helpful for her, and discussed history of developing coping skills to assist her with symptoms.  MOB stated that she has already talked with her OB about her increased risk for developing symptoms, and she shared that she will reach out and contact her OB if she notes symptoms.  CSW provided education on range of symptoms that may occur.  MOB acknowledged education, and expressed appreciation for the information.   MOB and FOB expressed appreciation for the visit and support. They agreed to contact CSW if questions arise prior to discharge.  CSW Plan/Description:   1)Patient/Family Education : Perinatal mood and anxiety disorders 2)No Further Intervention Required/No Barriers to Discharge    Tylor Gambrill N, LCSW 10/31/2014, 1:05 PM  

## 2014-10-31 NOTE — Progress Notes (Signed)
Post Partum Day 1 Subjective: no complaints, up ad lib, voiding, tolerating PO and + flatus  Objective: Blood pressure 96/50, pulse 54, temperature 98.1 F (36.7 C), temperature source Oral, resp. rate 16, height 5' (1.524 m), weight 56.246 kg (124 lb), SpO2 100 %, unknown if currently breastfeeding.  Physical Exam:  General: alert, cooperative and appears stated age Lochia: appropriate Uterine Fundus: firm    Recent Labs  10/29/14 2200 10/31/14 0533  HGB 11.9* 9.8*  HCT 34.5* 28.9*    Assessment/Plan: Plan for discharge tomorrow   LOS: 2 days   Aidin Doane H. 10/31/2014, 9:11 AM

## 2018-01-22 DIAGNOSIS — Z01419 Encounter for gynecological examination (general) (routine) without abnormal findings: Secondary | ICD-10-CM | POA: Diagnosis not present

## 2018-01-30 DIAGNOSIS — F411 Generalized anxiety disorder: Secondary | ICD-10-CM | POA: Diagnosis not present

## 2018-02-04 DIAGNOSIS — D485 Neoplasm of uncertain behavior of skin: Secondary | ICD-10-CM | POA: Diagnosis not present

## 2018-02-04 DIAGNOSIS — D225 Melanocytic nevi of trunk: Secondary | ICD-10-CM | POA: Diagnosis not present

## 2018-02-13 DIAGNOSIS — F411 Generalized anxiety disorder: Secondary | ICD-10-CM | POA: Diagnosis not present

## 2018-02-23 DIAGNOSIS — F411 Generalized anxiety disorder: Secondary | ICD-10-CM | POA: Diagnosis not present

## 2018-04-14 ENCOUNTER — Other Ambulatory Visit: Payer: Self-pay | Admitting: Obstetrics and Gynecology

## 2018-04-14 DIAGNOSIS — N63 Unspecified lump in unspecified breast: Secondary | ICD-10-CM | POA: Diagnosis not present

## 2018-04-14 DIAGNOSIS — Z681 Body mass index (BMI) 19 or less, adult: Secondary | ICD-10-CM | POA: Diagnosis not present

## 2018-04-14 DIAGNOSIS — N6310 Unspecified lump in the right breast, unspecified quadrant: Secondary | ICD-10-CM

## 2018-04-20 ENCOUNTER — Ambulatory Visit
Admission: RE | Admit: 2018-04-20 | Discharge: 2018-04-20 | Disposition: A | Discharge status: Home / Self Care | Payer: 59 | Source: Ambulatory Visit | Attending: Obstetrics and Gynecology | Admitting: Obstetrics and Gynecology

## 2018-04-20 ENCOUNTER — Ambulatory Visit
Admission: RE | Admit: 2018-04-20 | Discharge: 2018-04-20 | Disposition: A | Discharge status: Home / Self Care | Payer: BLUE CROSS/BLUE SHIELD | Source: Ambulatory Visit | Attending: Obstetrics and Gynecology | Admitting: Obstetrics and Gynecology

## 2018-04-20 DIAGNOSIS — N6489 Other specified disorders of breast: Secondary | ICD-10-CM | POA: Diagnosis not present

## 2018-04-20 DIAGNOSIS — N6310 Unspecified lump in the right breast, unspecified quadrant: Secondary | ICD-10-CM

## 2018-04-20 DIAGNOSIS — R922 Inconclusive mammogram: Secondary | ICD-10-CM | POA: Diagnosis not present

## 2018-04-21 DIAGNOSIS — L818 Other specified disorders of pigmentation: Secondary | ICD-10-CM | POA: Diagnosis not present

## 2018-04-21 DIAGNOSIS — D485 Neoplasm of uncertain behavior of skin: Secondary | ICD-10-CM | POA: Diagnosis not present

## 2018-05-15 DIAGNOSIS — F4322 Adjustment disorder with anxiety: Secondary | ICD-10-CM | POA: Diagnosis not present

## 2018-05-29 DIAGNOSIS — F4322 Adjustment disorder with anxiety: Secondary | ICD-10-CM | POA: Diagnosis not present

## 2018-06-10 DIAGNOSIS — F4322 Adjustment disorder with anxiety: Secondary | ICD-10-CM | POA: Diagnosis not present

## 2018-06-24 DIAGNOSIS — F4322 Adjustment disorder with anxiety: Secondary | ICD-10-CM | POA: Diagnosis not present

## 2018-07-08 DIAGNOSIS — F4322 Adjustment disorder with anxiety: Secondary | ICD-10-CM | POA: Diagnosis not present

## 2018-07-15 DIAGNOSIS — A6004 Herpesviral vulvovaginitis: Secondary | ICD-10-CM | POA: Diagnosis not present

## 2018-07-15 DIAGNOSIS — F331 Major depressive disorder, recurrent, moderate: Secondary | ICD-10-CM | POA: Diagnosis not present

## 2018-07-27 DIAGNOSIS — F4322 Adjustment disorder with anxiety: Secondary | ICD-10-CM | POA: Diagnosis not present

## 2018-08-25 DIAGNOSIS — F4322 Adjustment disorder with anxiety: Secondary | ICD-10-CM | POA: Diagnosis not present

## 2018-09-09 DIAGNOSIS — F4322 Adjustment disorder with anxiety: Secondary | ICD-10-CM | POA: Diagnosis not present

## 2018-10-29 DIAGNOSIS — F4322 Adjustment disorder with anxiety: Secondary | ICD-10-CM | POA: Diagnosis not present

## 2018-11-24 DIAGNOSIS — F4322 Adjustment disorder with anxiety: Secondary | ICD-10-CM | POA: Diagnosis not present

## 2019-01-07 ENCOUNTER — Other Ambulatory Visit: Payer: Self-pay

## 2019-01-07 DIAGNOSIS — Z20822 Contact with and (suspected) exposure to covid-19: Secondary | ICD-10-CM

## 2019-01-08 LAB — NOVEL CORONAVIRUS, NAA: SARS-CoV-2, NAA: NOT DETECTED

## 2019-02-10 DIAGNOSIS — F4322 Adjustment disorder with anxiety: Secondary | ICD-10-CM | POA: Diagnosis not present

## 2019-02-11 DIAGNOSIS — Z681 Body mass index (BMI) 19 or less, adult: Secondary | ICD-10-CM | POA: Diagnosis not present

## 2019-02-11 DIAGNOSIS — Z124 Encounter for screening for malignant neoplasm of cervix: Secondary | ICD-10-CM | POA: Diagnosis not present

## 2019-02-11 DIAGNOSIS — Z01419 Encounter for gynecological examination (general) (routine) without abnormal findings: Secondary | ICD-10-CM | POA: Diagnosis not present

## 2019-03-18 DIAGNOSIS — F4322 Adjustment disorder with anxiety: Secondary | ICD-10-CM | POA: Diagnosis not present

## 2019-04-07 DIAGNOSIS — F4322 Adjustment disorder with anxiety: Secondary | ICD-10-CM | POA: Diagnosis not present

## 2019-04-29 DIAGNOSIS — F4322 Adjustment disorder with anxiety: Secondary | ICD-10-CM | POA: Diagnosis not present

## 2019-05-11 DIAGNOSIS — F4322 Adjustment disorder with anxiety: Secondary | ICD-10-CM | POA: Diagnosis not present

## 2019-05-25 DIAGNOSIS — F4322 Adjustment disorder with anxiety: Secondary | ICD-10-CM | POA: Diagnosis not present

## 2019-06-08 DIAGNOSIS — F4322 Adjustment disorder with anxiety: Secondary | ICD-10-CM | POA: Diagnosis not present

## 2019-06-09 DIAGNOSIS — F4322 Adjustment disorder with anxiety: Secondary | ICD-10-CM | POA: Diagnosis not present

## 2019-06-23 DIAGNOSIS — F4322 Adjustment disorder with anxiety: Secondary | ICD-10-CM | POA: Diagnosis not present

## 2019-07-12 DIAGNOSIS — F4322 Adjustment disorder with anxiety: Secondary | ICD-10-CM | POA: Diagnosis not present

## 2019-07-26 DIAGNOSIS — F4322 Adjustment disorder with anxiety: Secondary | ICD-10-CM | POA: Diagnosis not present

## 2019-08-11 DIAGNOSIS — F4322 Adjustment disorder with anxiety: Secondary | ICD-10-CM | POA: Diagnosis not present

## 2019-08-13 IMAGING — MG DIGITAL DIAGNOSTIC BILATERAL MAMMOGRAM WITH TOMO AND CAD
6 of 10 series · 6 of 30 positions shown · non-contrast
Comparison: None.

CLINICAL DATA: Patient complains of a palpable abnormality in the
right breast.

EXAM:
DIGITAL DIAGNOSTIC BILATERAL MAMMOGRAM WITH CAD AND TOMO
ULTRASOUND RIGHT BREAST

[L CC synth-2D]
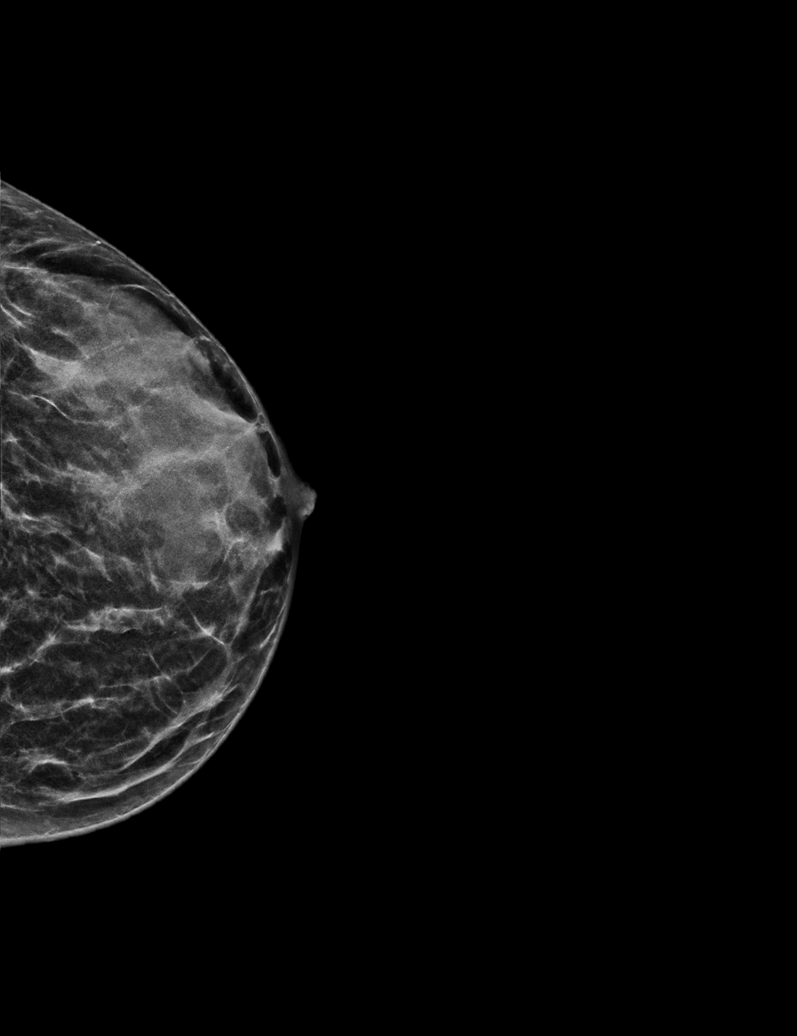

[R TAN synth-2D]
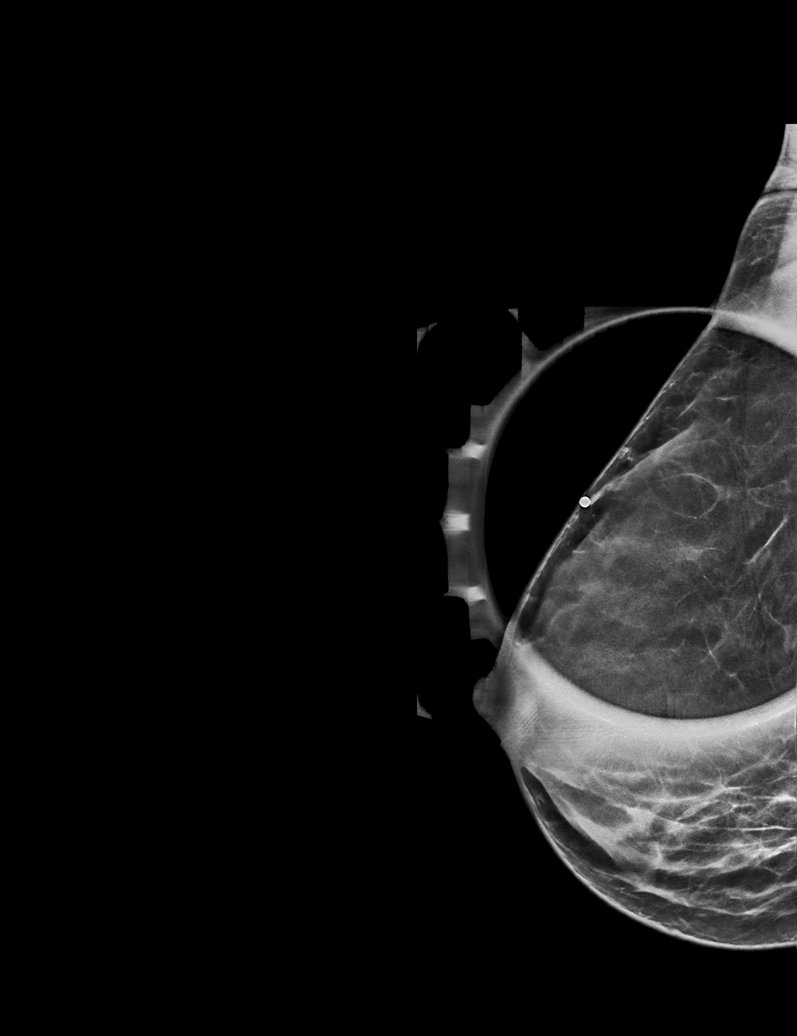

[R MLO synth-2D]
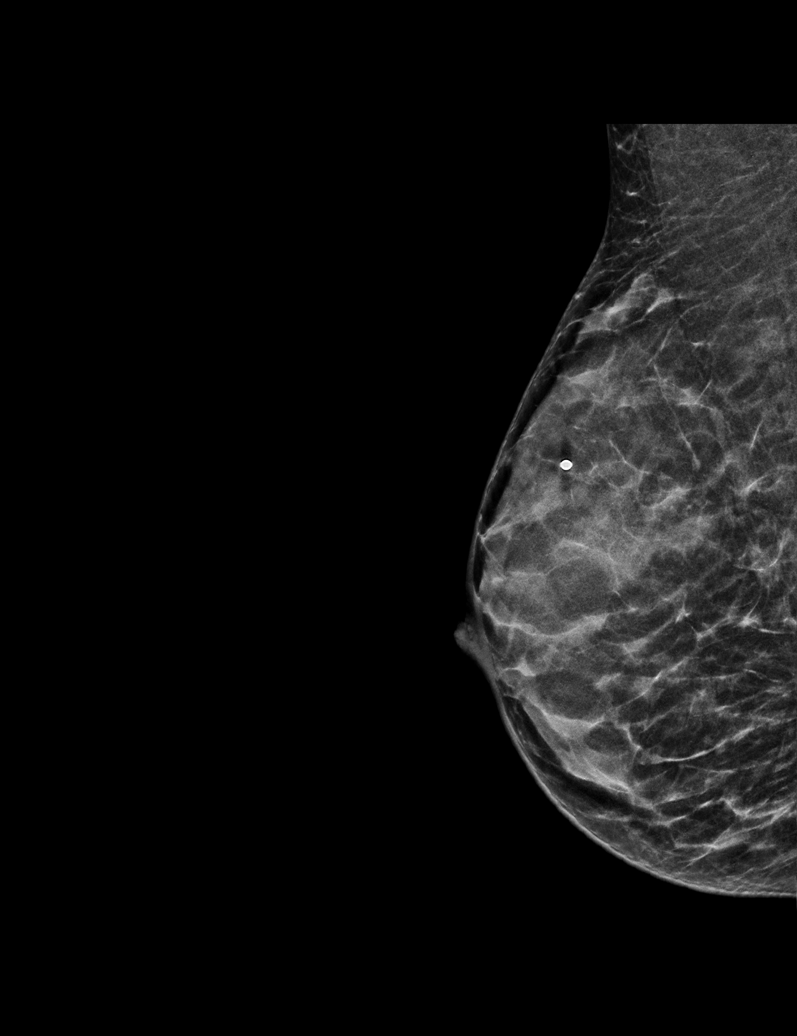

[L MLO synth-2D]
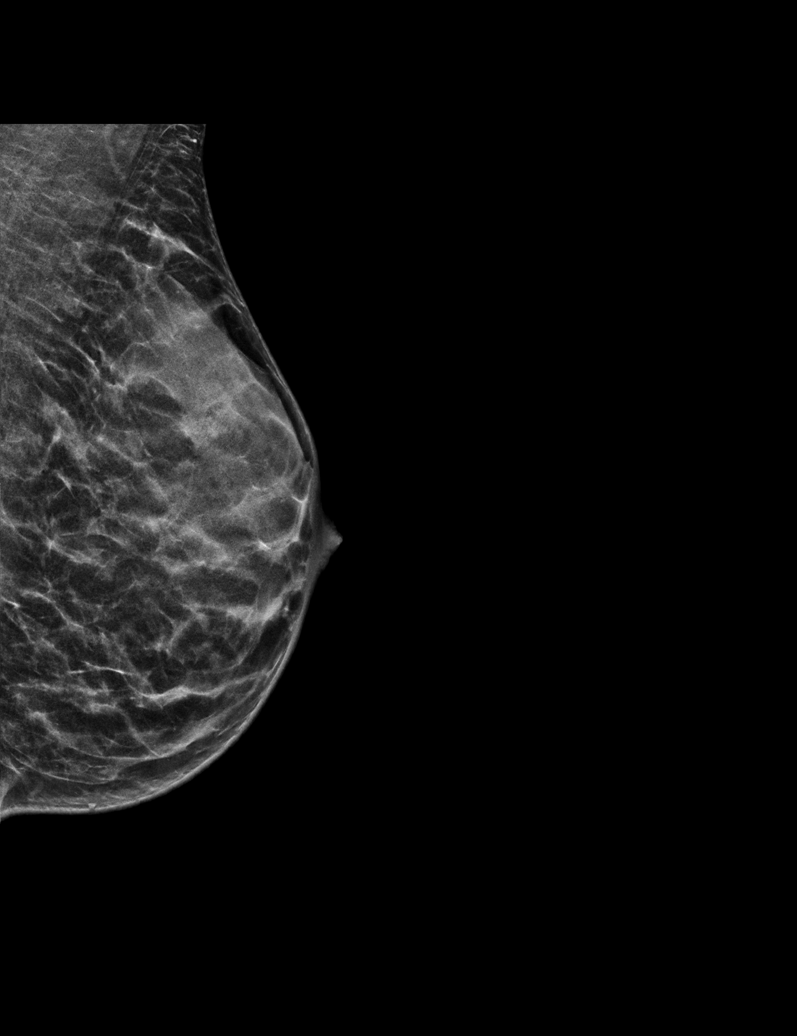

[R CC synth-2D]
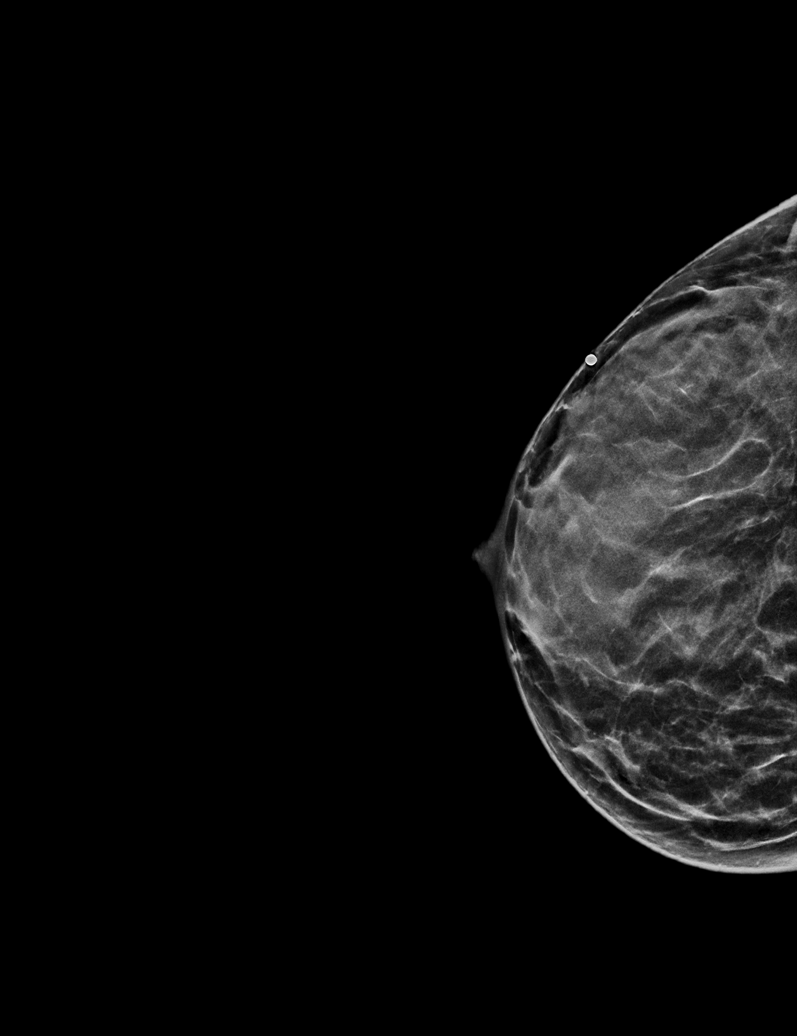

[L MLO tomo · tomo slice 21/40.0]
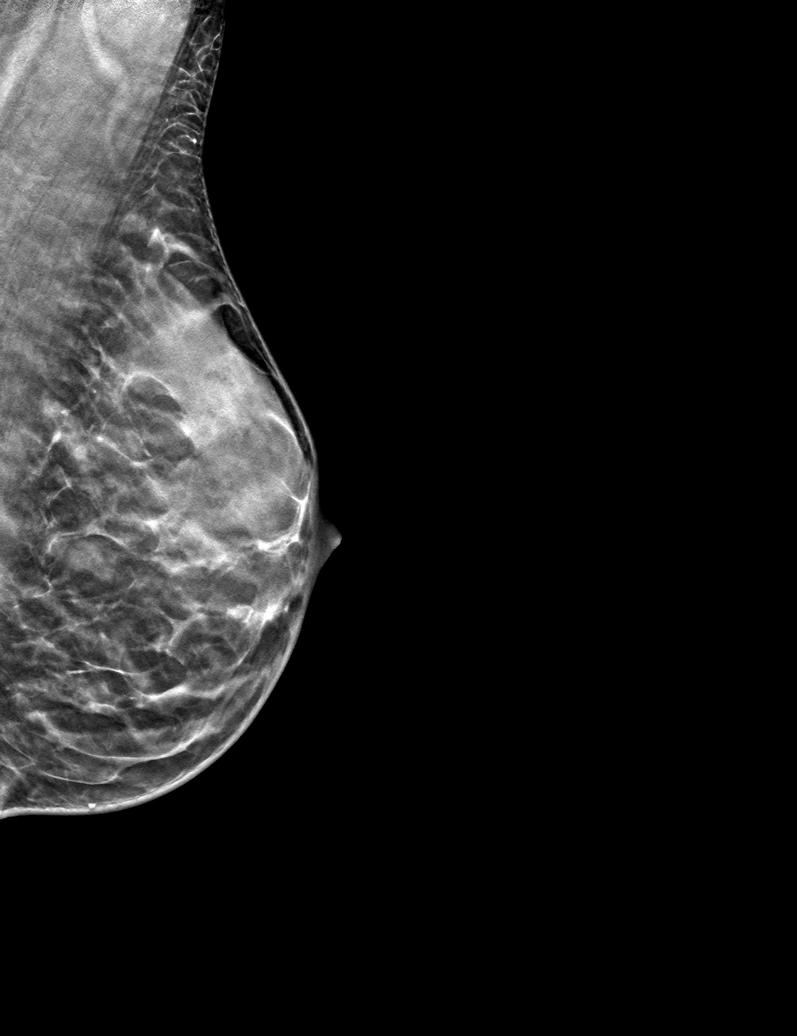

[6 of 30 positions shown; findings below may reference images not displayed]

ACR Breast Density Category d: The breast tissue is extremely dense,
which lowers the sensitivity of mammography.
FINDINGS: No suspicious mass, malignant type microcalcifications or distortion
detected in either breast.

Mammographic images were processed with CAD.

On physical exam, there is thickening in the upper-outer quadrant of
the right breast.

Targeted ultrasound is performed, showing normal tissue in the area
of clinical concern in the upper-outer quadrant of the right breast.
No solid or cystic mass, abnormal shadowing or distortion
visualized.
IMPRESSION: No evidence of malignancy in either breast.

RECOMMENDATION:
If the clinical exam remains benign/stable screening mammography can
be deferred until the age of 40.

The importance of self-breast examination was discussed with the
patient. If she feels any changes she should return to our facility
for further evaluation. Surgical consultation and MRI were also
discussed with the patient.

I have discussed the findings and recommendations with the patient.
Results were also provided in writing at the conclusion of the
visit. If applicable, a reminder letter will be sent to the patient
regarding the next appointment.

BI-RADS CATEGORY  1: Negative.

## 2019-08-25 DIAGNOSIS — F4322 Adjustment disorder with anxiety: Secondary | ICD-10-CM | POA: Diagnosis not present

## 2019-09-29 DIAGNOSIS — F331 Major depressive disorder, recurrent, moderate: Secondary | ICD-10-CM | POA: Diagnosis not present

## 2019-09-29 DIAGNOSIS — Z0000 Encounter for general adult medical examination without abnormal findings: Secondary | ICD-10-CM | POA: Diagnosis not present

## 2019-12-22 DIAGNOSIS — F411 Generalized anxiety disorder: Secondary | ICD-10-CM | POA: Diagnosis not present

## 2019-12-29 DIAGNOSIS — F411 Generalized anxiety disorder: Secondary | ICD-10-CM | POA: Diagnosis not present

## 2020-01-05 DIAGNOSIS — F411 Generalized anxiety disorder: Secondary | ICD-10-CM | POA: Diagnosis not present

## 2020-01-12 DIAGNOSIS — F411 Generalized anxiety disorder: Secondary | ICD-10-CM | POA: Diagnosis not present

## 2020-01-27 DIAGNOSIS — F411 Generalized anxiety disorder: Secondary | ICD-10-CM | POA: Diagnosis not present

## 2020-02-02 DIAGNOSIS — F411 Generalized anxiety disorder: Secondary | ICD-10-CM | POA: Diagnosis not present

## 2020-02-09 DIAGNOSIS — F411 Generalized anxiety disorder: Secondary | ICD-10-CM | POA: Diagnosis not present

## 2020-02-14 DIAGNOSIS — Z681 Body mass index (BMI) 19 or less, adult: Secondary | ICD-10-CM | POA: Diagnosis not present

## 2020-02-14 DIAGNOSIS — Z01419 Encounter for gynecological examination (general) (routine) without abnormal findings: Secondary | ICD-10-CM | POA: Diagnosis not present

## 2020-02-16 DIAGNOSIS — F411 Generalized anxiety disorder: Secondary | ICD-10-CM | POA: Diagnosis not present

## 2020-02-23 DIAGNOSIS — F411 Generalized anxiety disorder: Secondary | ICD-10-CM | POA: Diagnosis not present

## 2020-02-29 DIAGNOSIS — F411 Generalized anxiety disorder: Secondary | ICD-10-CM | POA: Diagnosis not present

## 2020-03-08 DIAGNOSIS — F411 Generalized anxiety disorder: Secondary | ICD-10-CM | POA: Diagnosis not present

## 2020-03-15 DIAGNOSIS — F411 Generalized anxiety disorder: Secondary | ICD-10-CM | POA: Diagnosis not present

## 2020-03-23 DIAGNOSIS — F411 Generalized anxiety disorder: Secondary | ICD-10-CM | POA: Diagnosis not present

## 2020-03-28 DIAGNOSIS — F411 Generalized anxiety disorder: Secondary | ICD-10-CM | POA: Diagnosis not present

## 2020-04-06 DIAGNOSIS — F411 Generalized anxiety disorder: Secondary | ICD-10-CM | POA: Diagnosis not present

## 2020-04-12 DIAGNOSIS — F411 Generalized anxiety disorder: Secondary | ICD-10-CM | POA: Diagnosis not present

## 2020-04-19 DIAGNOSIS — F411 Generalized anxiety disorder: Secondary | ICD-10-CM | POA: Diagnosis not present

## 2020-04-26 DIAGNOSIS — F411 Generalized anxiety disorder: Secondary | ICD-10-CM | POA: Diagnosis not present

## 2020-05-03 DIAGNOSIS — F411 Generalized anxiety disorder: Secondary | ICD-10-CM | POA: Diagnosis not present

## 2020-05-11 DIAGNOSIS — F411 Generalized anxiety disorder: Secondary | ICD-10-CM | POA: Diagnosis not present

## 2020-05-17 DIAGNOSIS — F411 Generalized anxiety disorder: Secondary | ICD-10-CM | POA: Diagnosis not present

## 2020-05-24 DIAGNOSIS — F411 Generalized anxiety disorder: Secondary | ICD-10-CM | POA: Diagnosis not present

## 2020-05-31 DIAGNOSIS — F411 Generalized anxiety disorder: Secondary | ICD-10-CM | POA: Diagnosis not present

## 2020-06-07 DIAGNOSIS — F411 Generalized anxiety disorder: Secondary | ICD-10-CM | POA: Diagnosis not present

## 2020-06-14 DIAGNOSIS — F411 Generalized anxiety disorder: Secondary | ICD-10-CM | POA: Diagnosis not present

## 2020-06-21 DIAGNOSIS — F411 Generalized anxiety disorder: Secondary | ICD-10-CM | POA: Diagnosis not present

## 2020-06-28 DIAGNOSIS — F411 Generalized anxiety disorder: Secondary | ICD-10-CM | POA: Diagnosis not present

## 2020-07-05 DIAGNOSIS — F411 Generalized anxiety disorder: Secondary | ICD-10-CM | POA: Diagnosis not present

## 2020-07-12 DIAGNOSIS — F411 Generalized anxiety disorder: Secondary | ICD-10-CM | POA: Diagnosis not present

## 2020-07-26 DIAGNOSIS — F411 Generalized anxiety disorder: Secondary | ICD-10-CM | POA: Diagnosis not present

## 2020-08-10 DIAGNOSIS — F411 Generalized anxiety disorder: Secondary | ICD-10-CM | POA: Diagnosis not present

## 2020-08-23 DIAGNOSIS — F411 Generalized anxiety disorder: Secondary | ICD-10-CM | POA: Diagnosis not present

## 2020-09-06 DIAGNOSIS — F411 Generalized anxiety disorder: Secondary | ICD-10-CM | POA: Diagnosis not present

## 2020-09-27 DIAGNOSIS — F411 Generalized anxiety disorder: Secondary | ICD-10-CM | POA: Diagnosis not present

## 2020-10-02 DIAGNOSIS — Z0000 Encounter for general adult medical examination without abnormal findings: Secondary | ICD-10-CM | POA: Diagnosis not present

## 2020-10-02 DIAGNOSIS — Z13220 Encounter for screening for lipoid disorders: Secondary | ICD-10-CM | POA: Diagnosis not present

## 2020-10-18 DIAGNOSIS — F411 Generalized anxiety disorder: Secondary | ICD-10-CM | POA: Diagnosis not present

## 2020-10-30 DIAGNOSIS — H52223 Regular astigmatism, bilateral: Secondary | ICD-10-CM | POA: Diagnosis not present

## 2020-10-30 DIAGNOSIS — H5213 Myopia, bilateral: Secondary | ICD-10-CM | POA: Diagnosis not present

## 2020-11-08 DIAGNOSIS — F411 Generalized anxiety disorder: Secondary | ICD-10-CM | POA: Diagnosis not present

## 2020-11-22 DIAGNOSIS — F411 Generalized anxiety disorder: Secondary | ICD-10-CM | POA: Diagnosis not present

## 2020-12-06 DIAGNOSIS — F411 Generalized anxiety disorder: Secondary | ICD-10-CM | POA: Diagnosis not present

## 2020-12-20 DIAGNOSIS — F411 Generalized anxiety disorder: Secondary | ICD-10-CM | POA: Diagnosis not present

## 2021-01-03 DIAGNOSIS — F411 Generalized anxiety disorder: Secondary | ICD-10-CM | POA: Diagnosis not present

## 2021-01-08 DIAGNOSIS — S0992XA Unspecified injury of nose, initial encounter: Secondary | ICD-10-CM | POA: Diagnosis not present

## 2021-01-08 DIAGNOSIS — S022XXA Fracture of nasal bones, initial encounter for closed fracture: Secondary | ICD-10-CM | POA: Diagnosis not present

## 2021-01-17 DIAGNOSIS — F411 Generalized anxiety disorder: Secondary | ICD-10-CM | POA: Diagnosis not present

## 2021-02-08 DIAGNOSIS — F411 Generalized anxiety disorder: Secondary | ICD-10-CM | POA: Diagnosis not present

## 2021-02-19 DIAGNOSIS — Z6820 Body mass index (BMI) 20.0-20.9, adult: Secondary | ICD-10-CM | POA: Diagnosis not present

## 2021-02-19 DIAGNOSIS — Z01419 Encounter for gynecological examination (general) (routine) without abnormal findings: Secondary | ICD-10-CM | POA: Diagnosis not present

## 2021-02-26 DIAGNOSIS — F411 Generalized anxiety disorder: Secondary | ICD-10-CM | POA: Diagnosis not present

## 2021-03-28 DIAGNOSIS — F411 Generalized anxiety disorder: Secondary | ICD-10-CM | POA: Diagnosis not present

## 2021-04-25 DIAGNOSIS — F411 Generalized anxiety disorder: Secondary | ICD-10-CM | POA: Diagnosis not present

## 2021-05-02 DIAGNOSIS — F418 Other specified anxiety disorders: Secondary | ICD-10-CM | POA: Diagnosis not present

## 2021-05-02 DIAGNOSIS — F331 Major depressive disorder, recurrent, moderate: Secondary | ICD-10-CM | POA: Diagnosis not present

## 2021-05-09 DIAGNOSIS — F411 Generalized anxiety disorder: Secondary | ICD-10-CM | POA: Diagnosis not present

## 2021-05-22 DIAGNOSIS — F411 Generalized anxiety disorder: Secondary | ICD-10-CM | POA: Diagnosis not present

## 2021-06-04 DIAGNOSIS — F331 Major depressive disorder, recurrent, moderate: Secondary | ICD-10-CM | POA: Diagnosis not present

## 2021-06-04 DIAGNOSIS — F418 Other specified anxiety disorders: Secondary | ICD-10-CM | POA: Diagnosis not present

## 2021-06-06 DIAGNOSIS — F411 Generalized anxiety disorder: Secondary | ICD-10-CM | POA: Diagnosis not present

## 2021-06-20 DIAGNOSIS — F411 Generalized anxiety disorder: Secondary | ICD-10-CM | POA: Diagnosis not present

## 2021-07-04 DIAGNOSIS — F411 Generalized anxiety disorder: Secondary | ICD-10-CM | POA: Diagnosis not present

## 2021-07-18 DIAGNOSIS — F411 Generalized anxiety disorder: Secondary | ICD-10-CM | POA: Diagnosis not present

## 2021-07-26 DIAGNOSIS — F411 Generalized anxiety disorder: Secondary | ICD-10-CM | POA: Diagnosis not present

## 2021-08-15 DIAGNOSIS — F411 Generalized anxiety disorder: Secondary | ICD-10-CM | POA: Diagnosis not present

## 2021-09-12 DIAGNOSIS — F411 Generalized anxiety disorder: Secondary | ICD-10-CM | POA: Diagnosis not present

## 2021-10-03 DIAGNOSIS — F411 Generalized anxiety disorder: Secondary | ICD-10-CM | POA: Diagnosis not present

## 2021-10-15 DIAGNOSIS — F418 Other specified anxiety disorders: Secondary | ICD-10-CM | POA: Diagnosis not present

## 2021-10-15 DIAGNOSIS — Z131 Encounter for screening for diabetes mellitus: Secondary | ICD-10-CM | POA: Diagnosis not present

## 2021-10-15 DIAGNOSIS — Z0000 Encounter for general adult medical examination without abnormal findings: Secondary | ICD-10-CM | POA: Diagnosis not present

## 2021-10-15 DIAGNOSIS — Z13220 Encounter for screening for lipoid disorders: Secondary | ICD-10-CM | POA: Diagnosis not present

## 2021-10-24 DIAGNOSIS — F411 Generalized anxiety disorder: Secondary | ICD-10-CM | POA: Diagnosis not present

## 2021-11-15 DIAGNOSIS — F411 Generalized anxiety disorder: Secondary | ICD-10-CM | POA: Diagnosis not present

## 2021-11-20 DIAGNOSIS — H52223 Regular astigmatism, bilateral: Secondary | ICD-10-CM | POA: Diagnosis not present

## 2021-11-20 DIAGNOSIS — H5213 Myopia, bilateral: Secondary | ICD-10-CM | POA: Diagnosis not present

## 2021-12-06 DIAGNOSIS — F411 Generalized anxiety disorder: Secondary | ICD-10-CM | POA: Diagnosis not present

## 2021-12-27 DIAGNOSIS — F411 Generalized anxiety disorder: Secondary | ICD-10-CM | POA: Diagnosis not present

## 2022-01-17 DIAGNOSIS — F411 Generalized anxiety disorder: Secondary | ICD-10-CM | POA: Diagnosis not present

## 2022-01-31 DIAGNOSIS — F411 Generalized anxiety disorder: Secondary | ICD-10-CM | POA: Diagnosis not present

## 2022-02-19 DIAGNOSIS — F411 Generalized anxiety disorder: Secondary | ICD-10-CM | POA: Diagnosis not present

## 2022-03-05 DIAGNOSIS — F411 Generalized anxiety disorder: Secondary | ICD-10-CM | POA: Diagnosis not present

## 2022-03-27 DIAGNOSIS — F411 Generalized anxiety disorder: Secondary | ICD-10-CM | POA: Diagnosis not present

## 2022-04-05 DIAGNOSIS — Z01419 Encounter for gynecological examination (general) (routine) without abnormal findings: Secondary | ICD-10-CM | POA: Diagnosis not present

## 2022-04-05 DIAGNOSIS — Z6820 Body mass index (BMI) 20.0-20.9, adult: Secondary | ICD-10-CM | POA: Diagnosis not present

## 2022-04-25 DIAGNOSIS — F411 Generalized anxiety disorder: Secondary | ICD-10-CM | POA: Diagnosis not present

## 2022-05-09 DIAGNOSIS — F411 Generalized anxiety disorder: Secondary | ICD-10-CM | POA: Diagnosis not present

## 2022-05-29 DIAGNOSIS — F411 Generalized anxiety disorder: Secondary | ICD-10-CM | POA: Diagnosis not present

## 2022-06-11 DIAGNOSIS — F411 Generalized anxiety disorder: Secondary | ICD-10-CM | POA: Diagnosis not present

## 2022-07-09 DIAGNOSIS — F411 Generalized anxiety disorder: Secondary | ICD-10-CM | POA: Diagnosis not present

## 2022-07-11 DIAGNOSIS — H6692 Otitis media, unspecified, left ear: Secondary | ICD-10-CM | POA: Diagnosis not present

## 2022-08-22 DIAGNOSIS — F411 Generalized anxiety disorder: Secondary | ICD-10-CM | POA: Diagnosis not present

## 2022-09-05 DIAGNOSIS — F411 Generalized anxiety disorder: Secondary | ICD-10-CM | POA: Diagnosis not present

## 2022-09-19 DIAGNOSIS — F411 Generalized anxiety disorder: Secondary | ICD-10-CM | POA: Diagnosis not present

## 2022-10-01 DIAGNOSIS — F411 Generalized anxiety disorder: Secondary | ICD-10-CM | POA: Diagnosis not present

## 2022-10-16 DIAGNOSIS — F411 Generalized anxiety disorder: Secondary | ICD-10-CM | POA: Diagnosis not present

## 2022-10-21 DIAGNOSIS — Z0000 Encounter for general adult medical examination without abnormal findings: Secondary | ICD-10-CM | POA: Diagnosis not present

## 2022-10-21 DIAGNOSIS — F418 Other specified anxiety disorders: Secondary | ICD-10-CM | POA: Diagnosis not present

## 2022-10-21 DIAGNOSIS — Z13220 Encounter for screening for lipoid disorders: Secondary | ICD-10-CM | POA: Diagnosis not present

## 2022-10-23 DIAGNOSIS — F411 Generalized anxiety disorder: Secondary | ICD-10-CM | POA: Diagnosis not present

## 2022-11-12 DIAGNOSIS — F411 Generalized anxiety disorder: Secondary | ICD-10-CM | POA: Diagnosis not present

## 2022-12-03 DIAGNOSIS — F411 Generalized anxiety disorder: Secondary | ICD-10-CM | POA: Diagnosis not present

## 2022-12-17 DIAGNOSIS — F411 Generalized anxiety disorder: Secondary | ICD-10-CM | POA: Diagnosis not present

## 2022-12-18 DIAGNOSIS — Z30433 Encounter for removal and reinsertion of intrauterine contraceptive device: Secondary | ICD-10-CM | POA: Diagnosis not present

## 2022-12-18 DIAGNOSIS — Z3202 Encounter for pregnancy test, result negative: Secondary | ICD-10-CM | POA: Diagnosis not present

## 2022-12-18 DIAGNOSIS — Z30430 Encounter for insertion of intrauterine contraceptive device: Secondary | ICD-10-CM | POA: Diagnosis not present

## 2022-12-19 DIAGNOSIS — F411 Generalized anxiety disorder: Secondary | ICD-10-CM | POA: Diagnosis not present

## 2023-01-02 DIAGNOSIS — F411 Generalized anxiety disorder: Secondary | ICD-10-CM | POA: Diagnosis not present

## 2023-01-07 DIAGNOSIS — F411 Generalized anxiety disorder: Secondary | ICD-10-CM | POA: Diagnosis not present

## 2023-01-13 DIAGNOSIS — K121 Other forms of stomatitis: Secondary | ICD-10-CM | POA: Diagnosis not present

## 2023-01-13 DIAGNOSIS — B09 Unspecified viral infection characterized by skin and mucous membrane lesions: Secondary | ICD-10-CM | POA: Diagnosis not present

## 2023-01-23 DIAGNOSIS — F411 Generalized anxiety disorder: Secondary | ICD-10-CM | POA: Diagnosis not present

## 2023-01-24 DIAGNOSIS — Z30431 Encounter for routine checking of intrauterine contraceptive device: Secondary | ICD-10-CM | POA: Diagnosis not present

## 2023-02-18 DIAGNOSIS — F411 Generalized anxiety disorder: Secondary | ICD-10-CM | POA: Diagnosis not present

## 2023-03-20 DIAGNOSIS — F411 Generalized anxiety disorder: Secondary | ICD-10-CM | POA: Diagnosis not present

## 2023-03-24 DIAGNOSIS — L578 Other skin changes due to chronic exposure to nonionizing radiation: Secondary | ICD-10-CM | POA: Diagnosis not present

## 2023-03-24 DIAGNOSIS — D2272 Melanocytic nevi of left lower limb, including hip: Secondary | ICD-10-CM | POA: Diagnosis not present

## 2023-03-24 DIAGNOSIS — L821 Other seborrheic keratosis: Secondary | ICD-10-CM | POA: Diagnosis not present

## 2023-03-24 DIAGNOSIS — L814 Other melanin hyperpigmentation: Secondary | ICD-10-CM | POA: Diagnosis not present

## 2023-04-15 DIAGNOSIS — Z124 Encounter for screening for malignant neoplasm of cervix: Secondary | ICD-10-CM | POA: Diagnosis not present

## 2023-04-15 DIAGNOSIS — Z01419 Encounter for gynecological examination (general) (routine) without abnormal findings: Secondary | ICD-10-CM | POA: Diagnosis not present

## 2023-04-24 DIAGNOSIS — F411 Generalized anxiety disorder: Secondary | ICD-10-CM | POA: Diagnosis not present

## 2023-05-22 DIAGNOSIS — F411 Generalized anxiety disorder: Secondary | ICD-10-CM | POA: Diagnosis not present

## 2023-06-19 DIAGNOSIS — F411 Generalized anxiety disorder: Secondary | ICD-10-CM | POA: Diagnosis not present

## 2023-11-04 ENCOUNTER — Other Ambulatory Visit: Payer: Self-pay | Admitting: Medical Genetics

## 2024-01-18 ENCOUNTER — Other Ambulatory Visit: Payer: Self-pay | Admitting: Medical Genetics

## 2024-01-18 DIAGNOSIS — Z006 Encounter for examination for normal comparison and control in clinical research program: Secondary | ICD-10-CM
